# Patient Record
Sex: Female | Born: 2000 | Race: White | Hispanic: No | Marital: Single | Smoking: Never smoker
Health system: Southern US, Community
[De-identification: ages and names within clinical notes are randomized; demographics above are authoritative.]

---
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
## 2020-05-02 DIAGNOSIS — Z1389 Encounter for screening for other disorder: Secondary | ICD-10-CM | POA: Diagnosis not present

## 2020-05-02 DIAGNOSIS — Z130 Encounter for screening for diseases of the blood and blood-forming organs and certain disorders involving the immune mechanism: Secondary | ICD-10-CM | POA: Diagnosis not present

## 2020-05-02 DIAGNOSIS — Z01419 Encounter for gynecological examination (general) (routine) without abnormal findings: Secondary | ICD-10-CM | POA: Diagnosis not present

## 2020-05-02 DIAGNOSIS — Z113 Encounter for screening for infections with a predominantly sexual mode of transmission: Secondary | ICD-10-CM | POA: Diagnosis not present

## 2020-05-02 DIAGNOSIS — E669 Obesity, unspecified: Secondary | ICD-10-CM | POA: Diagnosis not present

## 2020-12-02 ENCOUNTER — Other Ambulatory Visit: Payer: Self-pay

## 2020-12-02 ENCOUNTER — Emergency Department (HOSPITAL_BASED_OUTPATIENT_CLINIC_OR_DEPARTMENT_OTHER)
Admission: EM | Admit: 2020-12-02 | Discharge: 2020-12-03 | Disposition: A | Discharge status: Home / Self Care | Payer: BC Managed Care – PPO | Attending: Emergency Medicine | Admitting: Emergency Medicine

## 2020-12-02 ENCOUNTER — Encounter (HOSPITAL_BASED_OUTPATIENT_CLINIC_OR_DEPARTMENT_OTHER): Payer: Self-pay

## 2020-12-02 DIAGNOSIS — U071 COVID-19: Secondary | ICD-10-CM | POA: Diagnosis not present

## 2020-12-02 DIAGNOSIS — R197 Diarrhea, unspecified: Secondary | ICD-10-CM | POA: Diagnosis not present

## 2020-12-02 DIAGNOSIS — R1084 Generalized abdominal pain: Secondary | ICD-10-CM | POA: Insufficient documentation

## 2020-12-02 LAB — PREGNANCY, URINE: Preg Test, Ur: NEGATIVE

## 2020-12-02 LAB — CBC
HCT: 42.9 % (ref 36.0–46.0)
Hemoglobin: 14 g/dL (ref 12.0–15.0)
MCH: 28.1 pg (ref 26.0–34.0)
MCHC: 32.6 g/dL (ref 30.0–36.0)
MCV: 86 fL (ref 80.0–100.0)
Platelets: 317 10*3/uL (ref 150–400)
RBC: 4.99 MIL/uL (ref 3.87–5.11)
RDW: 12.2 % (ref 11.5–15.5)
WBC: 6 10*3/uL (ref 4.0–10.5)
nRBC: 0 % (ref 0.0–0.2)

## 2020-12-02 LAB — URINALYSIS, ROUTINE W REFLEX MICROSCOPIC
Bilirubin Urine: NEGATIVE
Glucose, UA: NEGATIVE mg/dL
Hgb urine dipstick: NEGATIVE
Ketones, ur: NEGATIVE mg/dL
Leukocytes,Ua: NEGATIVE
Nitrite: NEGATIVE
Protein, ur: NEGATIVE mg/dL
Specific Gravity, Urine: 1.02 (ref 1.005–1.030)
pH: 6.5 (ref 5.0–8.0)

## 2020-12-02 MED ORDER — SODIUM CHLORIDE 0.9 % IV SOLN: Freq: Once | INTRAVENOUS | Status: AC

## 2020-12-02 MED ORDER — MORPHINE SULFATE (PF) 2 MG/ML IV SOLN
2.0000 mg | Freq: Once | INTRAVENOUS | Status: DC
  Administered 2020-12-02: 2 mg via INTRAVENOUS
  Filled 2020-12-02: qty 1

## 2020-12-02 MED ORDER — ONDANSETRON 4 MG PO TBDP
4.0000 mg | ORAL_TABLET | Freq: Once | ORAL | Status: AC
  Administered 2020-12-02: 4 mg via ORAL
  Filled 2020-12-02: qty 1

## 2020-12-02 MED ORDER — ACETAMINOPHEN 325 MG PO TABS
650.0000 mg | ORAL_TABLET | Freq: Once | ORAL | Status: AC
  Administered 2020-12-02: 650 mg via ORAL
  Filled 2020-12-02: qty 2

## 2020-12-02 NOTE — ED Provider Notes (Signed)
[facility] EMERGENCY DEPARTMENT Provider Note   CSN: [id number] Arrival date & time: 12/02/20  2140     History Chief Complaint  Patient presents with   Abdominal Pain    Danielle Parks is a 20 y.o. female.  HPI Patient is a 20 year old female with a past medical history without any pertinent positives  Patient is presented to the ER today with generalized abdominal pains that began 3 days ago has been intermittent since she has had multiple episodes of diarrhea she states over the past 3 days she has had anywhere between 4 and 10 episodes of nonbloody diarrhea.  She states she has felt nauseous as well but not had any vomiting.  She denies any chest pain or shortness of breath.  She states currently she does not have any significant abdominal pain.  No melena or hematochezia noted.  She states that she recently went to [place] and only drank bottled water although patient pressure teeth with tap water.  She is with her boyfriend who accompanied her on this visit.  He has no illness.  She denies any cough, congestion.  States that she is not aware that she had a fever until she came to the ER temperature found of 100.3.  No recent antibiotics.     History reviewed. No pertinent past medical history.  There are no problems to display for this patient.   History reviewed. No pertinent surgical history.   OB History   No obstetric history on file.     No family history on file.  Social History   Tobacco Use   Smoking status: Never    Passive exposure: Never   Smokeless tobacco: Never  Vaping Use   Vaping Use: Every day   Substances: Nicotine  Substance Use Topics   Alcohol use: Yes   Drug use: Never    Home Medications Prior to Admission medications   Medication Sig Start Date End Date Taking? Authorizing Provider  ciprofloxacin (CIPRO) 500 MG tablet Take 1 tablet (500 mg total) by mouth every 12 (twelve) hours for 5 days. 12/03/20 12/08/20 Yes Arelyn Gauer,  Tong Pieczynski S, PA  dicyclomine (BENTYL) 20 MG tablet Take 1 tablet (20 mg total) by mouth 2 (two) times daily. 12/03/20  Yes Endora Teresi S, PA  famotidine (PEPCID) 20 MG tablet Take 1 tablet (20 mg total) by mouth 2 (two) times daily. 12/03/20  Yes Nancye Grumbine S, PA  ondansetron (ZOFRAN ODT) 4 MG disintegrating tablet Take 1 tablet (4 mg total) by mouth every 8 (eight) hours as needed for nausea or vomiting. 12/03/20  Yes Gailen Shelter, PA    Allergies    Patient has no known allergies.  Review of Systems   Review of Systems  Constitutional:  Positive for fatigue. Negative for chills and fever (Found to have fever).  HENT:  Negative for congestion.   Eyes:  Negative for pain.  Respiratory:  Negative for cough and shortness of breath.   Cardiovascular:  Negative for chest pain and leg swelling.  Gastrointestinal:  Positive for abdominal pain, diarrhea and nausea. Negative for vomiting.  Genitourinary:  Negative for dysuria.  Musculoskeletal:  Negative for myalgias.  Skin:  Negative for rash.  Neurological:  Negative for dizziness and headaches.   Physical Exam Updated Vital Signs BP (!) 135/93 (BP Location: Left Arm)   Pulse (!) 124   Temp 100.3 F (37.9 C) (Oral)   Resp 18   Ht 5' (1.524 m)   Wt 63.5 kg  LMP 11/27/2020   SpO2 100%   BMI 27.34 kg/m   Physical Exam Vitals and nursing note reviewed.  Constitutional:      General: She is not in acute distress.    Appearance: She is obese.     Comments: Pleasant well-appearing 20 year old.  In no acute distress.  Sitting comfortably in bed.  Able answer questions appropriately follow commands. No increased work of breathing. Speaking in full sentences.  HENT:     Head: Normocephalic and atraumatic.     Nose: Nose normal.     Mouth/Throat:     Mouth: Mucous membranes are dry.  Eyes:     General: No scleral icterus. Cardiovascular:     Rate and Rhythm: Normal rate and regular rhythm.     Pulses: Normal pulses.     Heart  sounds: Normal heart sounds.  Pulmonary:     Effort: Pulmonary effort is normal. No respiratory distress.     Breath sounds: No wheezing.  Abdominal:     Palpations: Abdomen is soft.     Tenderness: There is no abdominal tenderness. There is no right CVA tenderness or left CVA tenderness.  Musculoskeletal:     Cervical back: Normal range of motion.     Right lower leg: No edema.     Left lower leg: No edema.  Skin:    General: Skin is warm and dry.     Capillary Refill: Capillary refill takes less than 2 seconds.  Neurological:     Mental Status: She is alert. Mental status is at baseline.  Psychiatric:        Mood and Affect: Mood normal.        Behavior: Behavior normal.    ED Results / Procedures / Treatments   Labs (all labs ordered are listed, but only abnormal results are displayed) Labs Reviewed  COMPREHENSIVE METABOLIC PANEL - Abnormal; Notable for the following components:      Result Value   Glucose, Bld 123 (*)    Total Bilirubin 0.2 (*)    All other components within normal limits  RESP PANEL BY RT-PCR (FLU A&B, COVID) ARPGX2  LIPASE, BLOOD  CBC  URINALYSIS, ROUTINE W REFLEX MICROSCOPIC  PREGNANCY, URINE    EKG None  Radiology No results found.  Procedures Procedures   Medications Ordered in ED Medications  acetaminophen (TYLENOL) tablet 650 mg (650 mg Oral Given 12/02/20 2259)  0.9 %  sodium chloride infusion ( Intravenous New Bag/Given 12/02/20 2337)  morphine 2 MG/ML injection 2 mg (2 mg Intravenous Given 12/02/20 2335)  ondansetron (ZOFRAN-ODT) disintegrating tablet 4 mg (4 mg Oral Given 12/02/20 2259)    ED Course  I have reviewed the triage vital signs and the nursing notes.  Pertinent labs & imaging results that were available during my care of the patient were reviewed by me and considered in my medical decision making (see chart for details).    MDM Rules/Calculators/A&P                           Patient is 20 year old female presented  today with 3 days of significant diarrhea she was recently in [place]  She has purely GI symptoms making COVID-19 less likely as a cause of her diarrhea.  Physical exam without any abdominal tenderness.  Her marginally elevated at 100.3 presume that she had a true fever before antipyretics.  CBC without leukocytosis or anemia.  CMP unremarkable.  Urinalysis unremarkable.  Lipase within  normal meds at pancreatitis.  Given multiple risk factors for infectious diarrhea including red flag of fever and recent travel we will treat with Cipro floxacillin. Will also discharge home with Zofran, Bentyl, Pepcid.  Return precautions given.  She will follow-up with her primary care provider.  She understands the plan.  Vital signs improved significantly with heart rate now of 92.  Blood pressure within normal meds.  Temperature improved at 99.8  She is tolerating p.o.  Ambulatory upon discharge.  Final Clinical Impression(s) / ED Diagnoses Final diagnoses:  Diarrhea of presumed infectious origin    Rx / DC Orders ED Discharge Orders          Ordered    ciprofloxacin (CIPRO) 500 MG tablet  Every 12 hours        12/03/20 0012    ondansetron (ZOFRAN ODT) 4 MG disintegrating tablet  Every 8 hours PRN        12/03/20 0012    dicyclomine (BENTYL) 20 MG tablet  2 times daily        12/03/20 0012    famotidine (PEPCID) 20 MG tablet  2 times daily        12/03/20 0012             Gailen Shelter, [place] 12/03/20 0015    Sabas Sous, MD 12/03/20 0700

## 2020-12-02 NOTE — ED Triage Notes (Signed)
Patient just returned from [place] and having diarrhea.  Complains of lower and mid abdominal pain and pain with walking.

## 2020-12-03 DIAGNOSIS — U071 COVID-19: Secondary | ICD-10-CM | POA: Diagnosis not present

## 2020-12-03 LAB — RESP PANEL BY RT-PCR (FLU A&B, COVID) ARPGX2
Influenza A by PCR: NEGATIVE
Influenza B by PCR: NEGATIVE
SARS Coronavirus 2 by RT PCR: POSITIVE — AB

## 2020-12-03 LAB — COMPREHENSIVE METABOLIC PANEL
ALT: 19 U/L (ref 0–44)
AST: 19 U/L (ref 15–41)
Albumin: 3.8 g/dL (ref 3.5–5.0)
Alkaline Phosphatase: 46 U/L (ref 38–126)
Anion gap: 8 (ref 5–15)
BUN: 11 mg/dL (ref 6–20)
CO2: 24 mmol/L (ref 22–32)
Calcium: 9 mg/dL (ref 8.9–10.3)
Chloride: 108 mmol/L (ref 98–111)
Creatinine, Ser: 0.81 mg/dL (ref 0.44–1.00)
GFR, Estimated: >60 mL/min (ref >60)
Glucose, Bld: 123 mg/dL — ABNORMAL HIGH (ref 70–99)
Potassium: 3.7 mmol/L (ref 3.5–5.1)
Sodium: 140 mmol/L (ref 135–145)
Total Bilirubin: 0.2 mg/dL — ABNORMAL LOW (ref 0.3–1.2)
Total Protein: 7.3 g/dL (ref 6.5–8.1)

## 2020-12-03 LAB — LIPASE, BLOOD: Lipase: 19 U/L (ref 11–51)

## 2020-12-03 MED ORDER — CIPROFLOXACIN HCL 500 MG PO TABS: 500.0000 mg | ORAL_TABLET | Freq: Two times a day (BID) | ORAL | 0 refills | Status: AC

## 2020-12-03 MED ORDER — DICYCLOMINE HCL 20 MG PO TABS: 20.0000 mg | ORAL_TABLET | Freq: Two times a day (BID) | ORAL | 0 refills | Status: AC

## 2020-12-03 MED ORDER — FAMOTIDINE 20 MG PO TABS: 20.0000 mg | ORAL_TABLET | Freq: Two times a day (BID) | ORAL | 0 refills | Status: AC

## 2020-12-03 MED ORDER — ONDANSETRON 4 MG PO TBDP
4.0000 mg | ORAL_TABLET | Freq: Three times a day (TID) | ORAL | 0 refills | Status: AC | PRN
Start: 2020-12-03 — End: ?

## 2020-12-03 NOTE — Discharge Instructions (Addendum)
I have written you prescription for an antibiotic to cover for infectious diarrhea given your recent travel and your fever.  You do appear to be dehydrated.  Please drink plenty of Pedialyte and water.  I recommend rice, applesauce, toast, avocados make up a large majority of your diet for the next few days.  This will help with your diarrhea.  I also prescribed you Zofran to take for nausea as needed.  Also want you to take Pepcid twice daily.  Also continue to take Bentyl as needed for pain up to twice daily.  Please take the entire course of antibiotic I prescribed you.  Please use Tylenol or ibuprofen for pain.  You may use 600 mg ibuprofen every 6 hours or 1000 mg of Tylenol every 6 hours.  You may choose to alternate between the 2.  This would be most effective.  Not to exceed 4 g of Tylenol within 24 hours.  Not to exceed 3200 mg ibuprofen 24 hours.

## 2021-01-23 DIAGNOSIS — Z0000 Encounter for general adult medical examination without abnormal findings: Secondary | ICD-10-CM | POA: Diagnosis not present

## 2021-06-17 DIAGNOSIS — E669 Obesity, unspecified: Secondary | ICD-10-CM | POA: Diagnosis not present

## 2021-06-17 DIAGNOSIS — Z01419 Encounter for gynecological examination (general) (routine) without abnormal findings: Secondary | ICD-10-CM | POA: Diagnosis not present

## 2021-06-17 DIAGNOSIS — Z1389 Encounter for screening for other disorder: Secondary | ICD-10-CM | POA: Diagnosis not present

## 2021-06-17 DIAGNOSIS — Z113 Encounter for screening for infections with a predominantly sexual mode of transmission: Secondary | ICD-10-CM | POA: Diagnosis not present

## 2021-08-21 DIAGNOSIS — F411 Generalized anxiety disorder: Secondary | ICD-10-CM | POA: Diagnosis not present

## 2022-05-27 DIAGNOSIS — Z0000 Encounter for general adult medical examination without abnormal findings: Secondary | ICD-10-CM | POA: Diagnosis not present

## 2022-06-19 DIAGNOSIS — Z124 Encounter for screening for malignant neoplasm of cervix: Secondary | ICD-10-CM | POA: Diagnosis not present

## 2022-06-19 DIAGNOSIS — Z113 Encounter for screening for infections with a predominantly sexual mode of transmission: Secondary | ICD-10-CM | POA: Diagnosis not present

## 2022-06-19 DIAGNOSIS — Z01419 Encounter for gynecological examination (general) (routine) without abnormal findings: Secondary | ICD-10-CM | POA: Diagnosis not present

## 2023-07-07 DIAGNOSIS — Z23 Encounter for immunization: Secondary | ICD-10-CM | POA: Diagnosis not present

## 2023-07-07 DIAGNOSIS — Z0000 Encounter for general adult medical examination without abnormal findings: Secondary | ICD-10-CM | POA: Diagnosis not present

## 2023-07-27 DIAGNOSIS — Z130 Encounter for screening for diseases of the blood and blood-forming organs and certain disorders involving the immune mechanism: Secondary | ICD-10-CM | POA: Diagnosis not present

## 2023-07-27 DIAGNOSIS — Z01419 Encounter for gynecological examination (general) (routine) without abnormal findings: Secondary | ICD-10-CM | POA: Diagnosis not present

## 2023-11-06 ENCOUNTER — Encounter: Payer: Self-pay | Admitting: Advanced Practice Midwife
